# Patient Record
Sex: Female | Born: 1975 | Race: Black or African American | Hispanic: No | Marital: Single | Smoking: Never smoker
Health system: Southern US, Community
[De-identification: ages and names within clinical notes are randomized; demographics above are authoritative.]

## PROBLEM LIST (undated history)

## (undated) DIAGNOSIS — Z9851 Tubal ligation status: Secondary | ICD-10-CM

## (undated) DIAGNOSIS — Z302 Encounter for sterilization: Secondary | ICD-10-CM

## (undated) DIAGNOSIS — N200 Calculus of kidney: Secondary | ICD-10-CM

---

## 2000-03-09 DIAGNOSIS — N200 Calculus of kidney: Secondary | ICD-10-CM

## 2000-03-09 HISTORY — PX: LITHOTRIPSY: SUR834

## 2000-03-09 HISTORY — DX: Calculus of kidney: N20.0

## 2012-10-26 ENCOUNTER — Emergency Department (HOSPITAL_COMMUNITY): Payer: Self-pay

## 2012-10-26 ENCOUNTER — Encounter (HOSPITAL_COMMUNITY): Payer: Self-pay | Admitting: Emergency Medicine

## 2012-10-26 ENCOUNTER — Emergency Department (HOSPITAL_COMMUNITY)
Admission: EM | Admit: 2012-10-26 | Discharge: 2012-10-26 | Disposition: A | Discharge status: Home / Self Care | Payer: Self-pay | Attending: Emergency Medicine | Admitting: Emergency Medicine

## 2012-10-26 DIAGNOSIS — Z3202 Encounter for pregnancy test, result negative: Secondary | ICD-10-CM | POA: Insufficient documentation

## 2012-10-26 DIAGNOSIS — N949 Unspecified condition associated with female genital organs and menstrual cycle: Secondary | ICD-10-CM | POA: Insufficient documentation

## 2012-10-26 DIAGNOSIS — Z79899 Other long term (current) drug therapy: Secondary | ICD-10-CM | POA: Insufficient documentation

## 2012-10-26 DIAGNOSIS — N898 Other specified noninflammatory disorders of vagina: Secondary | ICD-10-CM | POA: Insufficient documentation

## 2012-10-26 DIAGNOSIS — N83209 Unspecified ovarian cyst, unspecified side: Secondary | ICD-10-CM | POA: Insufficient documentation

## 2012-10-26 DIAGNOSIS — R42 Dizziness and giddiness: Secondary | ICD-10-CM | POA: Insufficient documentation

## 2012-10-26 DIAGNOSIS — R109 Unspecified abdominal pain: Secondary | ICD-10-CM | POA: Insufficient documentation

## 2012-10-26 LAB — BASIC METABOLIC PANEL WITH GFR
BUN: 7 mg/dL (ref 6–23)
CO2: 26 meq/L (ref 19–32)
Calcium: 9.7 mg/dL (ref 8.4–10.5)
Chloride: 99 meq/L (ref 96–112)
Creatinine, Ser: 0.87 mg/dL (ref 0.50–1.10)
GFR calc Af Amer: >90 mL/min (ref >90)
GFR calc non Af Amer: 84 mL/min — ABNORMAL LOW (ref >90)
Glucose, Bld: 94 mg/dL (ref 70–99)
Potassium: 3.7 meq/L (ref 3.5–5.1)
Sodium: 135 meq/L (ref 135–145)

## 2012-10-26 LAB — CBC WITH DIFFERENTIAL/PLATELET
Basophils Relative: 0 % (ref 0–1)
Eosinophils Absolute: 0.1 10*3/uL (ref 0.0–0.7)
Hemoglobin: 11.7 g/dL — ABNORMAL LOW (ref 12.0–15.0)
MCH: 26 pg (ref 26.0–34.0)
MCHC: 32.1 g/dL (ref 30.0–36.0)
Monocytes Absolute: 0.4 10*3/uL (ref 0.1–1.0)
Monocytes Relative: 4 % (ref 3–12)
Neutrophils Relative %: 66 % (ref 43–77)

## 2012-10-26 LAB — URINALYSIS, ROUTINE W REFLEX MICROSCOPIC
Bilirubin Urine: NEGATIVE
Ketones, ur: NEGATIVE mg/dL
Nitrite: NEGATIVE
Urobilinogen, UA: 0.2 mg/dL (ref 0.0–1.0)

## 2012-10-26 LAB — WET PREP, GENITAL
Clue Cells Wet Prep HPF POC: NONE SEEN
Trich, Wet Prep: NONE SEEN
Yeast Wet Prep HPF POC: NONE SEEN

## 2012-10-26 LAB — URINE MICROSCOPIC-ADD ON

## 2012-10-26 LAB — POCT PREGNANCY, URINE: Preg Test, Ur: NEGATIVE

## 2012-10-26 MED ORDER — KETOROLAC TROMETHAMINE 30 MG/ML IJ SOLN
30.0000 mg | Freq: Once | INTRAMUSCULAR | Status: AC
  Administered 2012-10-26: 30 mg via INTRAVENOUS
  Filled 2012-10-26: qty 1

## 2012-10-26 MED ORDER — IBUPROFEN 800 MG PO TABS: 800.0000 mg | ORAL_TABLET | Freq: Three times a day (TID) | ORAL | Status: DC

## 2012-10-26 MED ORDER — SODIUM CHLORIDE 0.9 % IV BOLUS (SEPSIS)
1000.0000 mL | Freq: Once | INTRAVENOUS | Status: AC
Start: 2012-10-26 — End: 2012-10-26
  Administered 2012-10-26: 1000 mL via INTRAVENOUS

## 2012-10-26 NOTE — ED Notes (Signed)
Patient states that she has been having dizziness for a month but been avoiding being checked.  Additional complaints of vaginal discharge with lower abdominal pain.   She states that she had a change in sexual partners recently and had a condom break.

## 2012-10-26 NOTE — ED Notes (Signed)
Patient is alert and oriented x3.  She was given DC instructions and follow up visit instructions.  Patient gave verbal understanding. She was DC ambulatory under her own power to home.  V/S stable.  He was not showing any signs of distress on DC 

## 2012-10-26 NOTE — ED Notes (Signed)
Pt states she has been getting dizzy spells x 1 month.  Denies ever losing consciousness.  States that she has abd "discomfort" (RLQ).  States she has had vag discharge x 1 wk.  Describes it as yellow.

## 2012-10-26 NOTE — ED Provider Notes (Signed)
TIME SEEN: 8:00 PM  CHIEF COMPLAINT: Dizziness, right-sided abdominal pain, vaginal discharge  HPI: Patient is a 37 year old female G4 P4 who presents emergency department with lightheadedness for the past month. She states it has been intermittent and worse with change of position. She reports that she has been stressed recently and not eating as well as normal. She also reports over the past week she's had yellow vaginal discharge and right sided pelvic pain. She reports her last menstrual period was 3 weeks ago. She currently has an IUD. She has 2 sexual partners. No history of STI's. She states recently with one of her sexual partners the condom broke. She has not had any fever, nausea, vomiting, diarrhea, dysuria or hematuria, vaginal bleeding.  ROS: See HPI Constitutional: no fever  Eyes: no drainage  ENT: no runny nose   Cardiovascular:  no chest pain  Resp: no SOB  GI: no vomiting GU: no dysuria Integumentary: no rash  Allergy: no hives  Musculoskeletal: no leg swelling  Neurological: no slurred speech ROS otherwise negative  PAST MEDICAL HISTORY/PAST SURGICAL HISTORY:  History reviewed. No pertinent past medical history.  MEDICATIONS:  Prior to Admission medications   Medication Sig Start Date End Date Taking? Authorizing Provider  Multiple Vitamin (MULTIVITAMIN WITH MINERALS) TABS tablet Take 1 tablet by mouth daily.   Yes Historical Provider, MD  PARAGARD INTRAUTERINE COPPER IUD IUD 1 each by Intrauterine route once.   Yes Historical Provider, MD    ALLERGIES:  No Known Allergies  SOCIAL HISTORY:  History  Substance Use Topics  . Smoking status: Never Smoker   . Smokeless tobacco: Not on file  . Alcohol Use: Yes    FAMILY HISTORY: History reviewed. No pertinent family history.  EXAM: BP 127/100  Pulse 93  Temp(Src) 98.4 F (36.9 C) (Oral)  Resp 18  SpO2 95%  LMP 09/14/2012 CONSTITUTIONAL: Alert and oriented and responds appropriately to questions.  Well-appearing; well-nourished HEAD: Normocephalic EYES: Conjunctivae clear, PERRL ENT: normal nose; no rhinorrhea; moist mucous membranes; pharynx without lesions noted NECK: Supple, no meningismus, no LAD  CARD: RRR; S1 and S2 appreciated; no murmurs, no clicks, no rubs, no gallops RESP: Normal chest excursion without splinting or tachypnea; breath sounds clear and equal bilaterally; no wheezes, no rhonchi, no rales,  ABD/GI: Normal bowel sounds; non-distended; soft, palpation in the right pelvic region, no rebound, no guarding GU: Normal external genitalia, no genital lesions, white, thick vaginal discharge, no vaginal bleeding, patient has mild right adnexal tenderness without fullness, no cervical motion tenderness BACK:  The back appears normal and is non-tender to palpation, there is no CVA tenderness EXT: Normal ROM in all joints; non-tender to palpation; no edema; normal capillary refill; no cyanosis    SKIN: Normal color for age and race; warm NEURO: Moves all extremities equally PSYCH: The patient's mood and manner are appropriate. Grooming and personal hygiene are appropriate.  MEDICAL DECISION MAKING: Patient with lightheadedness for the past month has been intermittent. She has no risk factors for ACS or pulmonary embolus. She states she has not been eating or drinking well due to stress. Her symptoms seem to be related to change of position. She is hemodynamically stable the ED. Patient is also complaining of right-sided abdominal pain and vaginal discharge. We'll obtain urine, labs, pelvic exam with cultures and transvaginal ultrasound to evaluate for ectopic pregnancy, ovarian cysts, tubo-ovarian abscess. Given patient is well-appearing and appears comfortable in the room, doubt ovarian torsion.  ED PROGRESS: Labs are unremarkable other  than a mild leukocytosis. Patient does have a small amount of blood in her urine but no other sign of infection. Her transvaginal ultrasound shows a  right-sided ovarian cyst which may be a cause of her pain. She feels much better after Toradol IV fluids. Pelvic exam reveals mild right-sided adnexal tenderness without fullness. She also has white, thick vaginal discharge. Wet prep is pending. No cervical motion tenderness.  Wet prep negative. No signs or symptoms of sexual transmitted disease, PID that requires empiric treatment today. Current Chlamydia pending. We'll discharge home with return precautions, PCP followup, supportive care instructions. Patient verbalizes understanding and is comfortable this plan.  Abigail Maw Zakkary Thibault, DO 10/26/12 2112

## 2012-10-27 LAB — GC/CHLAMYDIA PROBE AMP
CT Probe RNA: NEGATIVE
GC Probe RNA: NEGATIVE

## 2012-11-01 ENCOUNTER — Ambulatory Visit: Payer: Medicaid Other | Admitting: Advanced Practice Midwife

## 2013-07-25 ENCOUNTER — Emergency Department (HOSPITAL_COMMUNITY)
Admission: EM | Admit: 2013-07-25 | Discharge: 2013-07-25 | Disposition: A | Discharge status: Home / Self Care | Payer: 59 | Source: Home / Self Care | Attending: Emergency Medicine | Admitting: Emergency Medicine

## 2013-07-25 ENCOUNTER — Encounter (HOSPITAL_COMMUNITY): Payer: Self-pay | Admitting: Emergency Medicine

## 2013-07-25 DIAGNOSIS — N390 Urinary tract infection, site not specified: Secondary | ICD-10-CM

## 2013-07-25 HISTORY — DX: Calculus of kidney: N20.0

## 2013-07-25 LAB — POCT URINALYSIS DIP (DEVICE)
BILIRUBIN URINE: NEGATIVE
GLUCOSE, UA: NEGATIVE mg/dL
KETONES UR: NEGATIVE mg/dL
NITRITE: NEGATIVE
PH: 5.5 (ref 5.0–8.0)
Protein, ur: >=300 mg/dL — AB
Specific Gravity, Urine: 1.025 (ref 1.005–1.030)
Urobilinogen, UA: 0.2 mg/dL (ref 0.0–1.0)

## 2013-07-25 MED ORDER — PHENAZOPYRIDINE HCL 200 MG PO TABS: 200.0000 mg | ORAL_TABLET | Freq: Three times a day (TID) | ORAL | Status: DC | PRN

## 2013-07-25 MED ORDER — CEPHALEXIN 500 MG PO CAPS: 500.0000 mg | ORAL_CAPSULE | Freq: Three times a day (TID) | ORAL | Status: DC

## 2013-07-25 NOTE — ED Notes (Signed)
C/o pressure and pain in low abdomen and low back onset 5/17.  No burning.  Voiding freq. in small amounts. No hematuria. After urinating she feels something like pressure in her upper abdomen that moves down.

## 2013-07-25 NOTE — Discharge Instructions (Signed)
Urinary Tract Infection  Urinary tract infections (UTIs) can develop anywhere along your urinary tract. Your urinary tract is your body's drainage system for removing wastes and extra water. Your urinary tract includes two kidneys, two ureters, a bladder, and a urethra. Your kidneys are a pair of bean-shaped organs. Each kidney is about the size of your fist. They are located below your ribs, one on each side of your spine.  CAUSES  Infections are caused by microbes, which are microscopic organisms, including fungi, viruses, and bacteria. These organisms are so small that they can only be seen through a microscope. Bacteria are the microbes that most commonly cause UTIs.  SYMPTOMS   Symptoms of UTIs may vary by age and gender of the patient and by the location of the infection. Symptoms in young women typically include a frequent and intense urge to urinate and a painful, burning feeling in the bladder or urethra during urination. Older women and men are more likely to be tired, shaky, and weak and have muscle aches and abdominal pain. A fever may mean the infection is in your kidneys. Other symptoms of a kidney infection include pain in your back or sides below the ribs, nausea, and vomiting.  DIAGNOSIS  To diagnose a UTI, your caregiver will ask you about your symptoms. Your caregiver also will ask to provide a urine sample. The urine sample will be tested for bacteria and white blood cells. White blood cells are made by your body to help fight infection.  TREATMENT   Typically, UTIs can be treated with medication. Because most UTIs are caused by a bacterial infection, they usually can be treated with the use of antibiotics. The choice of antibiotic and length of treatment depend on your symptoms and the type of bacteria causing your infection.  HOME CARE INSTRUCTIONS   If you were prescribed antibiotics, take them exactly as your caregiver instructs you. Finish the medication even if you feel better after you  have only taken some of the medication.   Drink enough water and fluids to keep your urine clear or pale yellow.   Avoid caffeine, tea, and carbonated beverages. They tend to irritate your bladder.   Empty your bladder often. Avoid holding urine for long periods of time.   Empty your bladder before and after sexual intercourse.   After a bowel movement, women should cleanse from front to back. Use each tissue only once.  SEEK MEDICAL CARE IF:    You have back pain.   You develop a fever.   Your symptoms do not begin to resolve within 3 days.  SEEK IMMEDIATE MEDICAL CARE IF:    You have severe back pain or lower abdominal pain.   You develop chills.   You have nausea or vomiting.   You have continued burning or discomfort with urination.  MAKE SURE YOU:    Understand these instructions.   Will watch your condition.   Will get help right away if you are not doing well or get worse.  Document Released: 12/03/2004 Document Revised: 08/25/2011 Document Reviewed: 04/03/2011  [employer] Patient Information 2014 [employer].

## 2013-07-25 NOTE — ED Provider Notes (Signed)
Chief Complaint   Chief Complaint  Patient presents with  . Urinary Tract Infection    History of Present Illness   Abigail Morris is a 38 year old female who has had a three-day history of urinary frequency, urgency, dysuria, and older. She denies any hematuria. She has felt chilled but had no fever. No nausea or vomiting. She's had generalized abdominal and back pain. Denies any GYN complaints. She has had a urinary tract infection but was years ago.  Review of Systems   Other than as noted above, the patient denies any of the following symptoms: General:  No fevers or chills. GI:  No abdominal pain, back pain, nausea, or vomiting. GU:  No hematuria or incontinence. GYN:  No discharge, itching, vulvar pain or lesions, pelvic pain, or abnormal vaginal bleeding.  PMFSH   Past medical history, family history, social history, meds, and allergies were reviewed.  She has a ParaGard intrauterine device.  Physical Examination     Vital signs:  BP 131/79  Pulse 105  Temp(Src) 98.4 F (36.9 C) (Oral)  Resp 16  SpO2 100%  LMP 07/15/2013 Gen:  Alert, oriented, in no distress. Lungs:  Clear to auscultation, no wheezes, rales or rhonchi. Heart:  Regular rhythm, no gallop or murmer. Abdomen:  Flat and soft. There was mild, generalized tenderness to palpation.  No guarding, or rebound.  No hepato-splenomegaly or mass.  Bowel sounds were normally active.  No hernia. Back:  Mild left CVA tenderness.  Skin:  Clear, warm and dry.  Labs   Results for orders placed during the hospital encounter of 07/25/13  POCT URINALYSIS DIP (DEVICE)      Result Value Ref Range   Glucose, UA NEGATIVE  NEGATIVE mg/dL   Bilirubin Urine NEGATIVE  NEGATIVE   Ketones, ur NEGATIVE  NEGATIVE mg/dL   Specific Gravity, Urine 1.025  1.005 - 1.030   Hgb urine dipstick LARGE (*) NEGATIVE   pH 5.5  5.0 - 8.0   Protein, ur >=300 (*) NEGATIVE mg/dL   Urobilinogen, UA 0.2  0.0 - 1.0 mg/dL   Nitrite NEGATIVE   NEGATIVE   Leukocytes, UA SMALL (*) NEGATIVE     A urine culture was obtained.  Results are pending at this time and we will call about any positive results.  Assessment   The encounter diagnosis was UTI (lower urinary tract infection).   No evidence of pyelonephritis.    Plan   1.  Meds:  The following meds were prescribed:   Discharge Medication List as of 07/25/2013  6:56 PM    START taking these medications   Details  cephALEXin (KEFLEX) 500 MG capsule Take 1 capsule (500 mg total) by mouth 3 (three) times daily., Starting 07/25/2013, Until Discontinued, Normal    phenazopyridine (PYRIDIUM) 200 MG tablet Take 1 tablet (200 mg total) by mouth 3 (three) times daily as needed for pain., Starting 07/25/2013, Until Discontinued, Normal        2.  Patient Education/Counseling:  The patient was given appropriate handouts, self care instructions, and instructed in symptomatic relief. The patient was told to avoid intercourse for 10 days, get extra fluids, and return for a follow up with her primary care doctor at the completion of treatment for a repeat UA and culture.    3.  Follow up:  The patient was told to follow up here if no better in 3 to 4 days, or sooner if becoming worse in any way, and given some red flag symptoms such  as fever, persistent vomiting, or severe flank or abdominal pain which would prompt immediate return.     Reuben Likesavid C Elmarie Devlin, MD 07/25/13 2007

## 2013-07-27 LAB — URINE CULTURE: Special Requests: NORMAL

## 2013-07-27 NOTE — ED Notes (Signed)
Urine culture: >100,000 colonies E. Coli.  Pt. adequately treated with Keflex. Desiree LucySuzanne M [facility] 07/27/2013

## 2013-07-28 NOTE — Progress Notes (Signed)
Quick Note:  Results are abnormal as noted, but have been adequately treated. No further action necessary. ______ 

## 2013-12-05 ENCOUNTER — Encounter (HOSPITAL_COMMUNITY): Payer: Self-pay | Admitting: Family Medicine

## 2013-12-05 ENCOUNTER — Emergency Department (HOSPITAL_COMMUNITY)
Admission: EM | Admit: 2013-12-05 | Discharge: 2013-12-05 | Disposition: A | Discharge status: Home / Self Care | Payer: 59 | Source: Home / Self Care | Attending: Family Medicine | Admitting: Family Medicine

## 2013-12-05 DIAGNOSIS — S39012A Strain of muscle, fascia and tendon of lower back, initial encounter: Secondary | ICD-10-CM

## 2013-12-05 DIAGNOSIS — M62838 Other muscle spasm: Secondary | ICD-10-CM

## 2013-12-05 DIAGNOSIS — IMO0002 Reserved for concepts with insufficient information to code with codable children: Secondary | ICD-10-CM

## 2013-12-05 MED ORDER — DICLOFENAC SODIUM 75 MG PO TBEC: 75.0000 mg | DELAYED_RELEASE_TABLET | Freq: Two times a day (BID) | ORAL | Status: DC

## 2013-12-05 MED ORDER — CYCLOBENZAPRINE HCL 5 MG PO TABS: 5.0000 mg | ORAL_TABLET | Freq: Three times a day (TID) | ORAL | Status: DC | PRN

## 2013-12-05 NOTE — Discharge Instructions (Signed)
You are suffering from muscle strain and spasm from your fall Please start the voltaren as an antinflammatory for your back pain and the flexeril to help with spasms Please stay active and make sure to stretch regularly. Please apply heat and massage several times per day to your injured area This will get better with time There is no evidence of permanent injury   Back Exercises These exercises may help you when beginning to rehabilitate your injury. Your symptoms may resolve with or without further involvement from your physician, physical therapist or athletic trainer. While completing these exercises, remember:   Restoring tissue flexibility helps normal motion to return to the joints. This allows healthier, less painful movement and activity.  An effective stretch should be held for at least 30 seconds.  A stretch should never be painful. You should only feel a gentle lengthening or release in the stretched tissue. STRETCH - Extension, Prone on Elbows   Lie on your stomach on the floor, a bed will be too soft. Place your palms about shoulder width apart and at the height of your head.  Place your elbows under your shoulders. If this is too painful, stack pillows under your chest.  Allow your body to relax so that your hips drop lower and make contact more completely with the floor.  Hold this position for __________ seconds.  Slowly return to lying flat on the floor. Repeat __________ times. Complete this exercise __________ times per day.  RANGE OF MOTION - Extension, Prone Press Ups   Lie on your stomach on the floor, a bed will be too soft. Place your palms about shoulder width apart and at the height of your head.  Keeping your back as relaxed as possible, slowly straighten your elbows while keeping your hips on the floor. You may adjust the placement of your hands to maximize your comfort. As you gain motion, your hands will come more underneath your shoulders.  Hold this  position __________ seconds.  Slowly return to lying flat on the floor. Repeat __________ times. Complete this exercise __________ times per day.  RANGE OF MOTION- Quadruped, Neutral Spine   Assume a hands and knees position on a firm surface. Keep your hands under your shoulders and your knees under your hips. You may place padding under your knees for comfort.  Drop your head and point your tail bone toward the ground below you. This will round out your low back like an angry cat. Hold this position for __________ seconds.  Slowly lift your head and release your tail bone so that your back sags into a large arch, like an old horse.  Hold this position for __________ seconds.  Repeat this until you feel limber in your low back.  Now, find your "sweet spot." This will be the most comfortable position somewhere between the two previous positions. This is your neutral spine. Once you have found this position, tense your stomach muscles to support your low back.  Hold this position for __________ seconds. Repeat __________ times. Complete this exercise __________ times per day.  STRETCH - Flexion, Single Knee to Chest   Lie on a firm bed or floor with both legs extended in front of you.  Keeping one leg in contact with the floor, bring your opposite knee to your chest. Hold your leg in place by either grabbing behind your thigh or at your knee.  Pull until you feel a gentle stretch in your low back. Hold __________ seconds.  Slowly  release your grasp and repeat the exercise with the opposite side. Repeat __________ times. Complete this exercise __________ times per day.  STRETCH - Hamstrings, Standing  Stand or sit and extend your right / left leg, placing your foot on a chair or foot stool  Keeping a slight arch in your low back and your hips straight forward.  Lead with your chest and lean forward at the waist until you feel a gentle stretch in the back of your right / left knee or  thigh. (When done correctly, this exercise requires leaning only a small distance.)  Hold this position for __________ seconds. Repeat __________ times. Complete this stretch __________ times per day. STRENGTHENING - Deep Abdominals, Pelvic Tilt   Lie on a firm bed or floor. Keeping your legs in front of you, bend your knees so they are both pointed toward the ceiling and your feet are flat on the floor.  Tense your lower abdominal muscles to press your low back into the floor. This motion will rotate your pelvis so that your tail bone is scooping upwards rather than pointing at your feet or into the floor.  With a gentle tension and even breathing, hold this position for __________ seconds. Repeat __________ times. Complete this exercise __________ times per day.  STRENGTHENING - Abdominals, Crunches   Lie on a firm bed or floor. Keeping your legs in front of you, bend your knees so they are both pointed toward the ceiling and your feet are flat on the floor. Cross your arms over your chest.  Slightly tip your chin down without bending your neck.  Tense your abdominals and slowly lift your trunk high enough to just clear your shoulder blades. Lifting higher can put excessive stress on the low back and does not further strengthen your abdominal muscles.  Control your return to the starting position. Repeat __________ times. Complete this exercise __________ times per day.  STRENGTHENING - Quadruped, Opposite UE/LE Lift   Assume a hands and knees position on a firm surface. Keep your hands under your shoulders and your knees under your hips. You may place padding under your knees for comfort.  Find your neutral spine and gently tense your abdominal muscles so that you can maintain this position. Your shoulders and hips should form a rectangle that is parallel with the floor and is not twisted.  Keeping your trunk steady, lift your right hand no higher than your shoulder and then your left  leg no higher than your hip. Make sure you are not holding your breath. Hold this position __________ seconds.  Continuing to keep your abdominal muscles tense and your back steady, slowly return to your starting position. Repeat with the opposite arm and leg. Repeat __________ times. Complete this exercise __________ times per day. Document Released: 03/13/2005 Document Revised: 05/18/2011 Document Reviewed: 06/07/2008 [facility] Patient Information 2015 [employer], [place]. This information is not intended to replace advice given to you by your health care provider. Make sure you discuss any questions you have with your health care provider.

## 2013-12-05 NOTE — ED Provider Notes (Addendum)
CSN: [id number]     Arrival date & time 12/05/13  1935 History   First MD Initiated Contact with Patient 12/05/13 2052     No chief complaint on file.  (Consider location/radiation/quality/duration/timing/severity/associated sxs/prior Treatment) HPI  Fall: Fell on Friday. Slipped on floor at [facility]. Landed on lower back. No initial pain. Came on later that night. Deneis LOC, head trauma, loss of sensation, loss of sensation or function of LE. No saddle anesthesia. Still with lower back pain today. Aleve 400mg  w/ some benefit. Worse as the day goes on. Improves w/ rest.    Past Medical History  Diagnosis Date  . Kidney stones 2002   Past Surgical History  Procedure Laterality Date  . Lithotripsy  2002   No family history on file. History  Substance Use Topics  . Smoking status: Never Smoker   . Smokeless tobacco: Not on file  . Alcohol Use: Yes     Comment: occasional   OB History   Grav Para Term Preterm Abortions TAB SAB Ect Mult Living                 Review of Systems Per HPI with all other pertinent systems negative.   Allergies  Review of patient's allergies indicates no known allergies.  Home Medications   Prior to Admission medications   Medication Sig Start Date End Date Taking? Authorizing Provider  cephALEXin (KEFLEX) 500 MG capsule Take 1 capsule (500 mg total) by mouth 3 (three) times daily. 07/25/13   Reuben Likes, MD  cyclobenzaprine (FLEXERIL) 5 MG tablet Take 1-2 tablets (5-10 mg total) by mouth 3 (three) times daily as needed for muscle spasms. 12/05/13   Ozella Rocks, MD  diclofenac (VOLTAREN) 75 MG EC tablet Take 1 tablet (75 mg total) by mouth 2 (two) times daily. 12/05/13   Ozella Rocks, MD  ibuprofen (ADVIL,MOTRIN) 800 MG tablet Take 1 tablet (800 mg total) by mouth 3 (three) times daily. 10/26/12   Kristen N Ward, DO  Multiple Vitamin (MULTIVITAMIN WITH MINERALS) TABS tablet Take 1 tablet by mouth daily.    Historical Provider, MD   PARAGARD INTRAUTERINE COPPER IUD IUD 1 each by Intrauterine route once.    Historical Provider, MD  phenazopyridine (PYRIDIUM) 200 MG tablet Take 1 tablet (200 mg total) by mouth 3 (three) times daily as needed for pain. 07/25/13   Reuben Likes, MD   BP 143/91  Pulse 78  Temp(Src) 98.9 F (37.2 C) (Oral)  Resp 16  SpO2 100% Physical Exam  Constitutional: She appears well-developed and well-nourished. No distress.  HENT:  Head: Normocephalic and atraumatic.  Eyes: EOM are normal. Pupils are equal, round, and reactive to light.  Neck: Normal range of motion.  Cardiovascular: Normal rate and normal heart sounds.   Pulmonary/Chest: Effort normal and breath sounds normal.  Abdominal: Soft.  Musculoskeletal:  L mid lower back perispinal ttp and muscle tightness.  FROM but slow w/ Rotation due to pain.   Neurological: She is alert. No cranial nerve deficit. Coordination normal.  Skin: Skin is warm. She is not diaphoretic.  Psychiatric: She has a normal mood and affect. Her behavior is normal. Judgment and thought content normal.    ED Course  Procedures (including critical care time) Labs Review Labs Reviewed - No data to display  Imaging Review No results found.   MDM   1. Muscle spasm   2. Back strain, initial encounter    Voltaren and flexeril Exercises, massage, rest,  heat Precautions given and all questions answered  Shelly Flattenavid Merrell, MD Family Medicine 12/05/2013, 9:22 PM      Ozella Rocksavid J Merrell, MD 12/05/13 2122  Ozella Rocksavid J Merrell, MD 12/06/13 [phone]

## 2013-12-05 NOTE — ED Notes (Signed)
Fell on back.  Onset 9/25.

## 2014-01-10 ENCOUNTER — Other Ambulatory Visit (HOSPITAL_COMMUNITY): Payer: Self-pay | Admitting: Family Medicine

## 2014-01-10 DIAGNOSIS — M5440 Lumbago with sciatica, unspecified side: Secondary | ICD-10-CM

## 2014-01-25 ENCOUNTER — Ambulatory Visit (HOSPITAL_COMMUNITY): Payer: 59

## 2014-01-26 ENCOUNTER — Ambulatory Visit (HOSPITAL_COMMUNITY): Payer: 59

## 2014-11-16 ENCOUNTER — Encounter (HOSPITAL_COMMUNITY): Payer: Self-pay | Admitting: *Deleted

## 2014-11-22 ENCOUNTER — Encounter (HOSPITAL_COMMUNITY): Payer: Self-pay | Admitting: Obstetrics and Gynecology

## 2014-11-22 DIAGNOSIS — Z302 Encounter for sterilization: Secondary | ICD-10-CM

## 2014-11-22 HISTORY — DX: Encounter for sterilization: Z30.2

## 2014-11-22 NOTE — H&P (Signed)
Abigail Morris is an 39 y.o. female [id number] for tubal ligation. Pt had Paragard IUD removed this year, desires permanent sterilization.D/W pt r/b/a of surgery, pt voices understanding.    Pertinent Gynecological History: No abn pap, last 12/14 WNL No STDs [id number] 2 female, 2 female 32wk-40wk SVD x 4  No LMP recorded.    Past Medical History  Diagnosis Date  . Kidney stones 2002  . Admission for tubal ligation 11/22/2014    Past Surgical History  Procedure Laterality Date  . Lithotripsy  2002    History reviewed. No pertinent family history.  Social History:  reports that she has never smoked. She does not have any smokeless tobacco history on file. She reports that she drinks alcohol. She reports that she does not use illicit drugs.single, [facility] [employer]  Allergies:  Allergies  Allergen Reactions  . Tramadol Hcl Itching  Meds boric acid, flexeril, diclofenac, MVI, trazadone,     Review of Systems  Constitutional: Negative.   HENT: Negative.   Eyes: Negative.   Respiratory: Negative.   Cardiovascular: Negative.   Gastrointestinal: Negative.   Genitourinary: Negative.   Musculoskeletal: Negative.   Skin: Negative.   Neurological: Negative.   Psychiatric/Behavioral: Negative.     There were no vitals taken for this visit. Physical Exam  Constitutional: She is oriented to person, place, and time. She appears well-developed and well-nourished.  HENT:  Head: Normocephalic and atraumatic.  Cardiovascular: Normal rate and regular rhythm.   Respiratory: Effort normal and breath sounds normal. No respiratory distress. She has no wheezes.  GI: Soft. Bowel sounds are normal. She exhibits no distension. There is no tenderness.  Musculoskeletal: Normal range of motion.  Neurological: She is alert and oriented to person, place, and time.  Skin: Skin is warm and dry.  Psychiatric: She has a normal mood and affect. Her behavior is normal.    No results found for this or any  previous visit (from the past 24 hour(s)).  No results found.  Assessment/Plan: 14NW [id number] for BTL D/w pt r/b/a wishes to proceed  Bovard-Stuckert, Leveta Wahab 11/22/2014, 9:23 PM

## 2014-11-23 ENCOUNTER — Encounter (HOSPITAL_COMMUNITY): Payer: Self-pay | Admitting: Emergency Medicine

## 2014-11-23 ENCOUNTER — Ambulatory Visit (HOSPITAL_COMMUNITY)
Admission: RE | Admit: 2014-11-23 | Discharge: 2014-11-23 | Disposition: A | Discharge status: Home / Self Care | Payer: 59 | Source: Ambulatory Visit | Attending: Obstetrics and Gynecology | Admitting: Obstetrics and Gynecology

## 2014-11-23 ENCOUNTER — Encounter (HOSPITAL_COMMUNITY)
Admission: RE | Disposition: A | Discharge status: Home / Self Care | Payer: Self-pay | Source: Ambulatory Visit | Attending: Obstetrics and Gynecology

## 2014-11-23 ENCOUNTER — Ambulatory Visit (HOSPITAL_COMMUNITY): Payer: 59 | Admitting: Anesthesiology

## 2014-11-23 DIAGNOSIS — Z30432 Encounter for removal of intrauterine contraceptive device: Secondary | ICD-10-CM | POA: Diagnosis not present

## 2014-11-23 DIAGNOSIS — Z302 Encounter for sterilization: Secondary | ICD-10-CM

## 2014-11-23 DIAGNOSIS — Z9851 Tubal ligation status: Secondary | ICD-10-CM

## 2014-11-23 HISTORY — PX: IUD REMOVAL: SHX5392

## 2014-11-23 HISTORY — PX: LAPAROSCOPIC TUBAL LIGATION: SHX1937

## 2014-11-23 HISTORY — DX: Encounter for sterilization: Z30.2

## 2014-11-23 HISTORY — DX: Tubal ligation status: Z98.51

## 2014-11-23 LAB — CBC
HEMATOCRIT: 36.6 % (ref 36.0–46.0)
HEMOGLOBIN: 11.8 g/dL — AB (ref 12.0–15.0)
MCH: 26.5 pg (ref 26.0–34.0)
MCHC: 32.2 g/dL (ref 30.0–36.0)
MCV: 82.2 fL (ref 78.0–100.0)
Platelets: 299 10*3/uL (ref 150–400)
RBC: 4.45 MIL/uL (ref 3.87–5.11)
RDW: 13.9 % (ref 11.5–15.5)
WBC: 9.3 10*3/uL (ref 4.0–10.5)

## 2014-11-23 LAB — PREGNANCY, URINE: PREG TEST UR: NEGATIVE

## 2014-11-23 SURGERY — LIGATION, FALLOPIAN TUBE, LAPAROSCOPIC
Anesthesia: General

## 2014-11-23 MED ORDER — DEXAMETHASONE SODIUM PHOSPHATE 10 MG/ML IJ SOLN
INTRAMUSCULAR | Status: AC
  Filled 2014-11-23: qty 1

## 2014-11-23 MED ORDER — ONDANSETRON HCL 4 MG/2ML IJ SOLN
INTRAMUSCULAR | Status: DC | PRN
Start: 2014-11-23 — End: 2014-11-23
  Administered 2014-11-23: 4 mg via INTRAVENOUS

## 2014-11-23 MED ORDER — SCOPOLAMINE 1 MG/3DAYS TD PT72
MEDICATED_PATCH | TRANSDERMAL | Status: AC
  Administered 2014-11-23: 1.5 mg via TRANSDERMAL
  Filled 2014-11-23: qty 1

## 2014-11-23 MED ORDER — IBUPROFEN 800 MG PO TABS: 800.0000 mg | ORAL_TABLET | Freq: Three times a day (TID) | ORAL | Status: AC | PRN

## 2014-11-23 MED ORDER — LIDOCAINE HCL (CARDIAC) 20 MG/ML IV SOLN
INTRAVENOUS | Status: AC
  Filled 2014-11-23: qty 5

## 2014-11-23 MED ORDER — SODIUM CHLORIDE 0.9 % IJ SOLN
INTRAMUSCULAR | Status: AC
  Filled 2014-11-23: qty 10

## 2014-11-23 MED ORDER — MEPERIDINE HCL 25 MG/ML IJ SOLN: 6.2500 mg | INTRAMUSCULAR | Status: DC | PRN

## 2014-11-23 MED ORDER — OXYCODONE-ACETAMINOPHEN 5-325 MG PO TABS: 1.0000 | ORAL_TABLET | Freq: Four times a day (QID) | ORAL | Status: AC | PRN

## 2014-11-23 MED ORDER — SCOPOLAMINE 1 MG/3DAYS TD PT72
1.0000 | MEDICATED_PATCH | Freq: Once | TRANSDERMAL | Status: DC
  Administered 2014-11-23: 1.5 mg via TRANSDERMAL

## 2014-11-23 MED ORDER — METOCLOPRAMIDE HCL 5 MG/ML IJ SOLN: 10.0000 mg | Freq: Once | INTRAMUSCULAR | Status: DC | PRN

## 2014-11-23 MED ORDER — SODIUM CHLORIDE 0.9 % IJ SOLN
INTRAMUSCULAR | Status: DC | PRN
  Administered 2014-11-23: 10 mL

## 2014-11-23 MED ORDER — FENTANYL CITRATE (PF) 250 MCG/5ML IJ SOLN
INTRAMUSCULAR | Status: AC
  Filled 2014-11-23: qty 5

## 2014-11-23 MED ORDER — NEOSTIGMINE METHYLSULFATE 10 MG/10ML IV SOLN
INTRAVENOUS | Status: DC | PRN
  Administered 2014-11-23: 3 mg via INTRAVENOUS

## 2014-11-23 MED ORDER — MIDAZOLAM HCL 2 MG/2ML IJ SOLN
INTRAMUSCULAR | Status: DC | PRN
  Administered 2014-11-23: 2 mg via INTRAVENOUS

## 2014-11-23 MED ORDER — LACTATED RINGERS IV SOLN
INTRAVENOUS | Status: DC
  Administered 2014-11-23 (×2): via INTRAVENOUS

## 2014-11-23 MED ORDER — LACTATED RINGERS IV SOLN
INTRAVENOUS | Status: DC
  Administered 2014-11-23: 10:00:00 via INTRAVENOUS

## 2014-11-23 MED ORDER — PROPOFOL 10 MG/ML IV BOLUS
INTRAVENOUS | Status: AC
  Filled 2014-11-23: qty 20

## 2014-11-23 MED ORDER — ONDANSETRON HCL 4 MG/2ML IJ SOLN
INTRAMUSCULAR | Status: AC
  Filled 2014-11-23: qty 2

## 2014-11-23 MED ORDER — MIDAZOLAM HCL 2 MG/2ML IJ SOLN
INTRAMUSCULAR | Status: AC
  Filled 2014-11-23: qty 2

## 2014-11-23 MED ORDER — HYDROCODONE-ACETAMINOPHEN 7.5-325 MG PO TABS
ORAL_TABLET | ORAL | Status: AC
  Filled 2014-11-23: qty 1

## 2014-11-23 MED ORDER — BUPIVACAINE HCL (PF) 0.25 % IJ SOLN
INTRAMUSCULAR | Status: AC
  Filled 2014-11-23: qty 30

## 2014-11-23 MED ORDER — FENTANYL CITRATE (PF) 100 MCG/2ML IJ SOLN
INTRAMUSCULAR | Status: DC | PRN
  Administered 2014-11-23: 100 ug via INTRAVENOUS
  Administered 2014-11-23: 50 ug via INTRAVENOUS

## 2014-11-23 MED ORDER — FENTANYL CITRATE (PF) 100 MCG/2ML IJ SOLN
INTRAMUSCULAR | Status: AC
  Filled 2014-11-23: qty 2

## 2014-11-23 MED ORDER — GLYCOPYRROLATE 0.2 MG/ML IJ SOLN
INTRAMUSCULAR | Status: DC | PRN
  Administered 2014-11-23: 0.4 mg via INTRAVENOUS

## 2014-11-23 MED ORDER — FENTANYL CITRATE (PF) 100 MCG/2ML IJ SOLN
25.0000 ug | INTRAMUSCULAR | Status: DC | PRN
  Administered 2014-11-23: 50 ug via INTRAVENOUS

## 2014-11-23 MED ORDER — PROPOFOL 10 MG/ML IV BOLUS
INTRAVENOUS | Status: DC | PRN
  Administered 2014-11-23: 200 mg via INTRAVENOUS

## 2014-11-23 MED ORDER — BUPIVACAINE HCL (PF) 0.25 % IJ SOLN
INTRAMUSCULAR | Status: DC | PRN
  Administered 2014-11-23: 9 mL

## 2014-11-23 MED ORDER — ROCURONIUM BROMIDE 100 MG/10ML IV SOLN
INTRAVENOUS | Status: DC | PRN
  Administered 2014-11-23: 30 mg via INTRAVENOUS

## 2014-11-23 MED ORDER — DEXAMETHASONE SODIUM PHOSPHATE 10 MG/ML IJ SOLN
INTRAMUSCULAR | Status: DC | PRN
  Administered 2014-11-23: 5 mg via INTRAVENOUS

## 2014-11-23 MED ORDER — HYDROCODONE-ACETAMINOPHEN 7.5-325 MG PO TABS
1.0000 | ORAL_TABLET | Freq: Once | ORAL | Status: AC | PRN
  Administered 2014-11-23: 1 via ORAL

## 2014-11-23 MED ORDER — METOCLOPRAMIDE HCL 5 MG/ML IJ SOLN
INTRAMUSCULAR | Status: AC
  Filled 2014-11-23: qty 2

## 2014-11-23 MED ORDER — LIDOCAINE HCL 1 % IJ SOLN
INTRAMUSCULAR | Status: AC
  Filled 2014-11-23: qty 20

## 2014-11-23 MED ORDER — 0.9 % SODIUM CHLORIDE (POUR BTL) OPTIME
TOPICAL | Status: DC | PRN
  Administered 2014-11-23: 1000 mL

## 2014-11-23 MED ORDER — LIDOCAINE HCL (CARDIAC) 20 MG/ML IV SOLN
INTRAVENOUS | Status: DC | PRN
  Administered 2014-11-23: 100 mg via INTRAVENOUS

## 2014-11-23 SURGICAL SUPPLY — 29 items
CATH ROBINSON RED A/P 16FR (CATHETERS) ×4 IMPLANT
CHLORAPREP W/TINT 26ML (MISCELLANEOUS) ×4 IMPLANT
CLOTH BEACON ORANGE TIMEOUT ST (SAFETY) ×4 IMPLANT
CONTAINER PREFILL 10% NBF 60ML (FORM) ×8 IMPLANT
DECANTER SPIKE VIAL GLASS SM (MISCELLANEOUS) ×8 IMPLANT
DRSG COVADERM PLUS 2X2 (GAUZE/BANDAGES/DRESSINGS) ×8 IMPLANT
DRSG OPSITE POSTOP 3X4 (GAUZE/BANDAGES/DRESSINGS) IMPLANT
GLOVE BIO SURGEON STRL SZ 6.5 (GLOVE) ×3 IMPLANT
GLOVE BIO SURGEONS STRL SZ 6.5 (GLOVE) ×1
GOWN STRL REUS W/TWL LRG LVL3 (GOWN DISPOSABLE) ×8 IMPLANT
LIQUID BAND (GAUZE/BANDAGES/DRESSINGS) ×4 IMPLANT
NEEDLE INSUFFLATION 120MM (ENDOMECHANICALS) ×4 IMPLANT
NEEDLE SPNL 22GX3.5 QUINCKE BK (NEEDLE) ×4 IMPLANT
PACK LAPAROSCOPY BASIN (CUSTOM PROCEDURE TRAY) ×4 IMPLANT
PACK VAGINAL MINOR WOMEN LF (CUSTOM PROCEDURE TRAY) ×4 IMPLANT
PAD POSITIONING PINK XL (MISCELLANEOUS) ×4 IMPLANT
PAD PREP 24X48 CUFFED NSTRL (MISCELLANEOUS) ×4 IMPLANT
POUCH SPECIMEN RETRIEVAL 10MM (ENDOMECHANICALS) IMPLANT
PROTECTOR NERVE ULNAR (MISCELLANEOUS) ×8 IMPLANT
SHEARS HARMONIC ACE PLUS 36CM (ENDOMECHANICALS) IMPLANT
SUT VIC AB 3-0 CTX 36 (SUTURE) IMPLANT
SUT VIC AB 3-0 PS2 18 (SUTURE) ×2
SUT VIC AB 3-0 PS2 18XBRD (SUTURE) ×2 IMPLANT
SUT VICRYL 0 UR6 27IN ABS (SUTURE) IMPLANT
TOWEL OR 17X24 6PK STRL BLUE (TOWEL DISPOSABLE) ×8 IMPLANT
TROCAR XCEL NON-BLD 11X100MML (ENDOMECHANICALS) ×4 IMPLANT
TROCAR XCEL NON-BLD 5MMX100MML (ENDOMECHANICALS) IMPLANT
WARMER LAPAROSCOPE (MISCELLANEOUS) ×4 IMPLANT
WATER STERILE IRR 1000ML POUR (IV SOLUTION) ×4 IMPLANT

## 2014-11-23 NOTE — Anesthesia Postprocedure Evaluation (Signed)
Anesthesia Post Note  Patient: Abigail Morris  Procedure(s) Performed: Procedure(s) (LRB): LAPAROSCOPIC BILATERAL TUBAL LIGATION (Bilateral) INTRAUTERINE DEVICE (IUD) REMOVAL (N/A)  Anesthesia type: General  Patient location: PACU  Post pain: Pain level controlled  Post assessment: Post-op Vital signs reviewed  Last Vitals:  Filed Vitals:   11/23/14 1300  BP: 118/77  Pulse: 64  Temp:   Resp: 11    Post vital signs: Reviewed  Level of consciousness: sedated  Complications: No apparent anesthesia complications

## 2014-11-23 NOTE — Interval H&P Note (Signed)
History and Physical Interval Note:  11/23/2014 10:11 AM  Tonnette Y Lightle  has presented today for surgery, with the diagnosis of Sterilization  The various methods of treatment have been discussed with the patient and family. After consideration of risks, benefits and other options for treatment, the patient has consented to  Procedure(s): LAPAROSCOPIC BILATERAL TUBAL LIGATION (Bilateral) INTRAUTERINE DEVICE (IUD) REMOVAL (N/A) as a surgical intervention .  The patient's history has been reviewed, patient examined, no change in status, stable for surgery.  I have reviewed the patient's chart and labs.  Questions were answered to the patient's satisfaction.     Bovard-Stuckert, Julene Rahn

## 2014-11-23 NOTE — Anesthesia Procedure Notes (Signed)
Procedure Name: Intubation Date/Time: 11/23/2014 10:53 AM Performed by: Elgie Congo Pre-anesthesia Checklist: Patient identified, Emergency Drugs available, Suction available and Patient being monitored Patient Re-evaluated:Patient Re-evaluated prior to inductionOxygen Delivery Method: Circle system utilized Preoxygenation: Pre-oxygenation with 100% oxygen Intubation Type: IV induction Ventilation: Mask ventilation without difficulty Laryngoscope Size: Mac and 3 Grade View: Grade I Tube type: Oral Tube size: 7.0 mm Number of attempts: 1 Airway Equipment and Method: Stylet Placement Confirmation: ETT inserted through vocal cords under direct vision,  positive ETCO2 and breath sounds checked- equal and bilateral Secured at: 21 cm Tube secured with: Tape Dental Injury: Teeth and Oropharynx as per pre-operative assessment

## 2014-11-23 NOTE — Anesthesia Preprocedure Evaluation (Signed)
Anesthesia Evaluation  Patient identified by MRN, date of birth, ID band Patient awake    Reviewed: Allergy & Precautions, NPO status , Patient's Chart, lab work & pertinent test results  Airway Mallampati: II  TM Distance: >3 FB Neck ROM: Full    Dental no notable dental hx. (+) Teeth Intact   Pulmonary neg pulmonary ROS,    Pulmonary exam normal breath sounds clear to auscultation       Cardiovascular negative cardio ROS Normal cardiovascular exam Rhythm:Regular Rate:Normal     Neuro/Psych negative neurological ROS  negative psych ROS   GI/Hepatic negative GI ROS,   Endo/Other  Obesity  Renal/GU Renal diseaseHx/o Renal calculi  negative genitourinary   Musculoskeletal negative musculoskeletal ROS (+)   Abdominal (+) + obese,   Peds  Hematology   Anesthesia Other Findings   Reproductive/Obstetrics Desires sterilization                             Anesthesia Physical Anesthesia Plan  ASA: II  Anesthesia Plan: General   Post-op Pain Management:    Induction: Intravenous  Airway Management Planned: Oral ETT  Additional Equipment:   Intra-op Plan:   Post-operative Plan: Extubation in OR  Informed Consent: I have reviewed the patients History and Physical, chart, labs and discussed the procedure including the risks, benefits and alternatives for the proposed anesthesia with the patient or authorized representative who has indicated his/her understanding and acceptance.   Dental advisory given  Plan Discussed with: Anesthesiologist, CRNA and Surgeon  Anesthesia Plan Comments:         Anesthesia Quick Evaluation

## 2014-11-23 NOTE — Brief Op Note (Signed)
11/23/2014  11:38 AM  PATIENT:  Abigail Morris  39 y.o. female  PRE-OPERATIVE DIAGNOSIS:  Sterilization  POST-OPERATIVE DIAGNOSIS:  Sterilization  PROCEDURE:  Procedure(s): LAPAROSCOPIC BILATERAL TUBAL LIGATION (Bilateral) INTRAUTERINE DEVICE (IUD) REMOVAL (N/A)  SURGEON:  Surgeon(s) and Role:    * Sherian Rein, MD - Primary  ANESTHESIA:   local and general  EBL:  Total I/O In: 1000 [I.V.:1000] Out: 120 [Urine:100; Blood:20]  BLOOD ADMINISTERED:none  DRAINS: none   LOCAL MEDICATIONS USED:  MARCAINE     SPECIMEN:  No Specimen  DISPOSITION OF SPECIMEN:  N/A  COUNTS:  YES  TOURNIQUET:  * No tourniquets in log *  DICTATION: .Other Dictation: Dictation Number [id number]  PLAN OF CARE: Discharge to home after PACU  PATIENT DISPOSITION:  PACU - hemodynamically stable.   Delay start of Pharmacological VTE agent (>24hrs) due to surgical blood loss or risk of bleeding: not applicable

## 2014-11-23 NOTE — Discharge Instructions (Signed)

## 2014-11-23 NOTE — Transfer of Care (Signed)
Immediate Anesthesia Transfer of Care Note  Patient: Abigail Morris  Procedure(s) Performed: Procedure(s): LAPAROSCOPIC BILATERAL TUBAL LIGATION (Bilateral) INTRAUTERINE DEVICE (IUD) REMOVAL (N/A)  Patient Location: PACU  Anesthesia Type:General  Level of Consciousness: awake, alert  and oriented  Airway & Oxygen Therapy: Patient Spontanous Breathing and Patient connected to nasal cannula oxygen  Post-op Assessment: Report given to RN, Post -op Vital signs reviewed and stable and Patient moving all extremities  Post vital signs: Reviewed and stable  Last Vitals:  Filed Vitals:   11/23/14 0942  BP: 137/84  Pulse: 85  Temp: 36.8 C  Resp: 16    Complications: No apparent anesthesia complications

## 2014-11-24 NOTE — Op Note (Signed)
NAME:  Abigail Morris, WORTH NO.:  [id number]  MEDICAL RECORD NO.:  [id number]  LOCATION:  [facility]                          FACILITY:  [facility]  PHYSICIAN:  Sherron Monday, MD        DATE OF BIRTH:  1975-06-03  DATE OF PROCEDURE:  11/23/2014 DATE OF DISCHARGE:  11/23/2014                              OPERATIVE REPORT   PREOPERATIVE DIAGNOSIS:  Undesired fertility, desires removal of ParaGard IUD.  POSTOPERATIVE DIAGNOSIS:  Undesired fertility, desires removal of ParaGard IUD.  PROCEDURE:  Laparoscopic bilateral tubal ligation by fulguration and ParaGard IUD removal.  SURGEON:  Sherron Monday, MD.  ANESTHESIA:  8 mL of 0.25% Marcaine and general anesthesia.  IV FLUIDS:  2000 mL.  URINE OUTPUT:  100 mL.  Clear urine at the start of the procedure.  EBL:  Approximately 20 mL.  PATHOLOGY:  None.  COMPLICATIONS:  None.  DESCRIPTION OF PROCEDURE:  After informed consent was reviewed with the patient, she was transported to the OR, placed on the table in supine position.  She was placed in the [place] stirrups and her arms were draped.  A brief time-out was performed.  The patient was anesthetized and open-sided speculum was placed in the vagina.  The IUD strings were easily seen, grasped and removed.  The Hulka manipulator was then placed in her uterus with the aid of a single-tooth tenaculum.  The bladder was sterilely drained.  Gloves were changed.  Attention was turned to the abdominal portion of the case.  An approximately 2 cm infraumbilical incision was made using the Veress needle.  Pneumoperitoneum was obtained after the hanging drop test.  The operative scope was used. The tubes, ovaries, and uterus were constantly identified and appeared normal.  The tubes were ligated using the triple burn technique with the Kleppinger forceps and aid of the ammeter bilaterally.  Hemostasis was assured.  The gas was evacuated from the abdomen.  The trocar was placed under direct  visualization.  The trocar was removed.  A single deep suture of 0 Vicryl was used to reapproximate the fascia.  A 3-0 was used to approximate the subcu tissue.  Dermabond was applied.  The patient tolerated the procedure well.  Sponge, lap, and needle counts were correct x2 per the operating room staff.     Sherron Monday, MD     JB/MEDQ  D:  11/23/2014  T:  11/23/2014  Job:  [id number]

## 2014-11-26 ENCOUNTER — Encounter (HOSPITAL_COMMUNITY): Payer: Self-pay | Admitting: Obstetrics and Gynecology

## 2015-03-22 DIAGNOSIS — Z0000 Encounter for general adult medical examination without abnormal findings: Secondary | ICD-10-CM | POA: Diagnosis not present

## 2015-03-22 DIAGNOSIS — Z113 Encounter for screening for infections with a predominantly sexual mode of transmission: Secondary | ICD-10-CM | POA: Diagnosis not present

## 2015-07-31 ENCOUNTER — Other Ambulatory Visit: Payer: Self-pay

## 2015-07-31 DIAGNOSIS — Z1231 Encounter for screening mammogram for malignant neoplasm of breast: Secondary | ICD-10-CM

## 2015-08-26 ENCOUNTER — Ambulatory Visit
Admission: RE | Admit: 2015-08-26 | Discharge: 2015-08-26 | Disposition: A | Discharge status: Home / Self Care | Payer: 59 | Source: Ambulatory Visit

## 2015-08-26 DIAGNOSIS — Z1231 Encounter for screening mammogram for malignant neoplasm of breast: Secondary | ICD-10-CM

## 2015-08-26 MED FILL — metroNIDAZOLE 0.75 % GEL: 0.75 | 5 days supply | Qty: 70 | Fill #0

## 2016-08-19 ENCOUNTER — Other Ambulatory Visit: Payer: Self-pay | Admitting: Obstetrics and Gynecology

## 2016-08-19 DIAGNOSIS — Z1231 Encounter for screening mammogram for malignant neoplasm of breast: Secondary | ICD-10-CM

## 2016-10-19 ENCOUNTER — Ambulatory Visit: Payer: 59

## 2016-10-19 ENCOUNTER — Ambulatory Visit
Admission: RE | Admit: 2016-10-19 | Discharge: 2016-10-19 | Disposition: A | Discharge status: Home / Self Care | Payer: PRIVATE HEALTH INSURANCE | Source: Ambulatory Visit | Attending: Obstetrics and Gynecology | Admitting: Obstetrics and Gynecology

## 2016-10-19 DIAGNOSIS — Z1231 Encounter for screening mammogram for malignant neoplasm of breast: Secondary | ICD-10-CM

## 2018-01-28 ENCOUNTER — Other Ambulatory Visit: Payer: Self-pay | Admitting: Obstetrics and Gynecology

## 2018-01-28 DIAGNOSIS — Z1231 Encounter for screening mammogram for malignant neoplasm of breast: Secondary | ICD-10-CM

## 2018-12-30 ENCOUNTER — Other Ambulatory Visit: Payer: Self-pay | Admitting: Obstetrics and Gynecology

## 2018-12-30 DIAGNOSIS — Z1231 Encounter for screening mammogram for malignant neoplasm of breast: Secondary | ICD-10-CM

## 2019-02-20 ENCOUNTER — Other Ambulatory Visit: Payer: Self-pay

## 2019-02-20 ENCOUNTER — Ambulatory Visit
Admission: RE | Admit: 2019-02-20 | Discharge: 2019-02-20 | Disposition: A | Discharge status: Home / Self Care | Payer: BC Managed Care – PPO | Source: Ambulatory Visit | Attending: Obstetrics and Gynecology | Admitting: Obstetrics and Gynecology

## 2019-02-20 DIAGNOSIS — Z1231 Encounter for screening mammogram for malignant neoplasm of breast: Secondary | ICD-10-CM

## 2019-02-22 ENCOUNTER — Other Ambulatory Visit: Payer: Self-pay | Admitting: Obstetrics and Gynecology

## 2019-02-22 DIAGNOSIS — R928 Other abnormal and inconclusive findings on diagnostic imaging of breast: Secondary | ICD-10-CM

## 2019-02-28 ENCOUNTER — Other Ambulatory Visit: Payer: BC Managed Care – PPO

## 2019-03-08 ENCOUNTER — Other Ambulatory Visit: Payer: Self-pay

## 2019-03-08 ENCOUNTER — Ambulatory Visit
Admission: RE | Admit: 2019-03-08 | Discharge: 2019-03-08 | Disposition: A | Discharge status: Home / Self Care | Payer: BC Managed Care – PPO | Source: Ambulatory Visit | Attending: Obstetrics and Gynecology | Admitting: Obstetrics and Gynecology

## 2019-03-08 DIAGNOSIS — R928 Other abnormal and inconclusive findings on diagnostic imaging of breast: Secondary | ICD-10-CM

## 2020-01-09 ENCOUNTER — Other Ambulatory Visit: Payer: Self-pay | Admitting: Obstetrics and Gynecology

## 2020-01-09 DIAGNOSIS — Z1231 Encounter for screening mammogram for malignant neoplasm of breast: Secondary | ICD-10-CM

## 2020-02-23 ENCOUNTER — Other Ambulatory Visit: Payer: Self-pay

## 2020-02-23 ENCOUNTER — Ambulatory Visit
Admission: RE | Admit: 2020-02-23 | Discharge: 2020-02-23 | Disposition: A | Discharge status: Home / Self Care | Payer: BC Managed Care – PPO | Source: Ambulatory Visit | Attending: Obstetrics and Gynecology | Admitting: Obstetrics and Gynecology

## 2020-02-23 DIAGNOSIS — Z1231 Encounter for screening mammogram for malignant neoplasm of breast: Secondary | ICD-10-CM

## 2020-12-21 IMAGING — MG DIGITAL SCREENING BILAT W/ TOMO W/ CAD
8 series · 8 of 24 positions shown · non-contrast
Comparison: Previous exam(s).

CLINICAL DATA: Screening.

EXAM:
DIGITAL SCREENING BILATERAL MAMMOGRAM WITH TOMO AND CAD

[L MLO synth-2D]
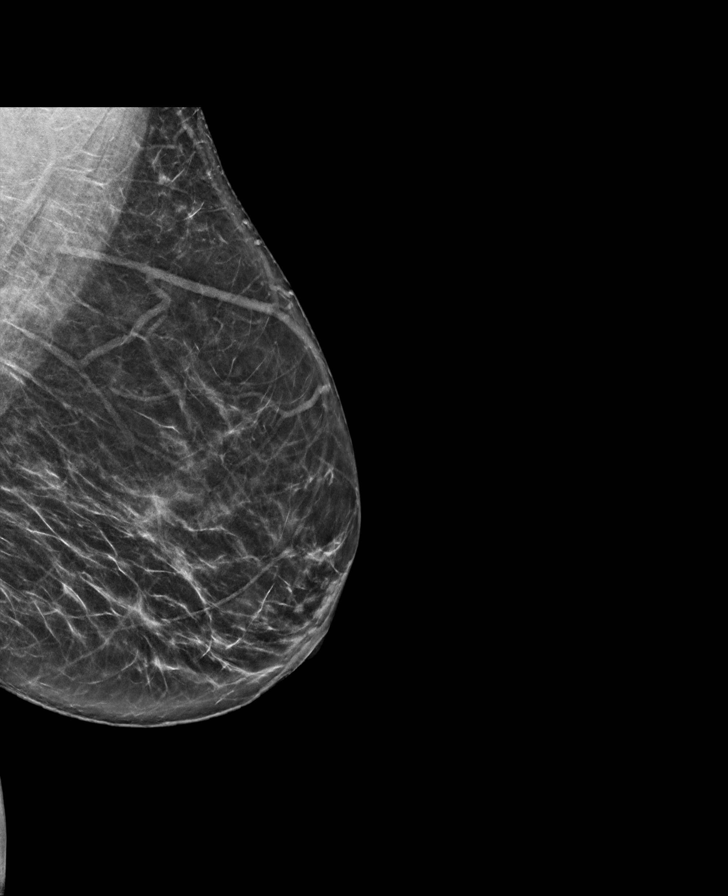

[L CC synth-2D]
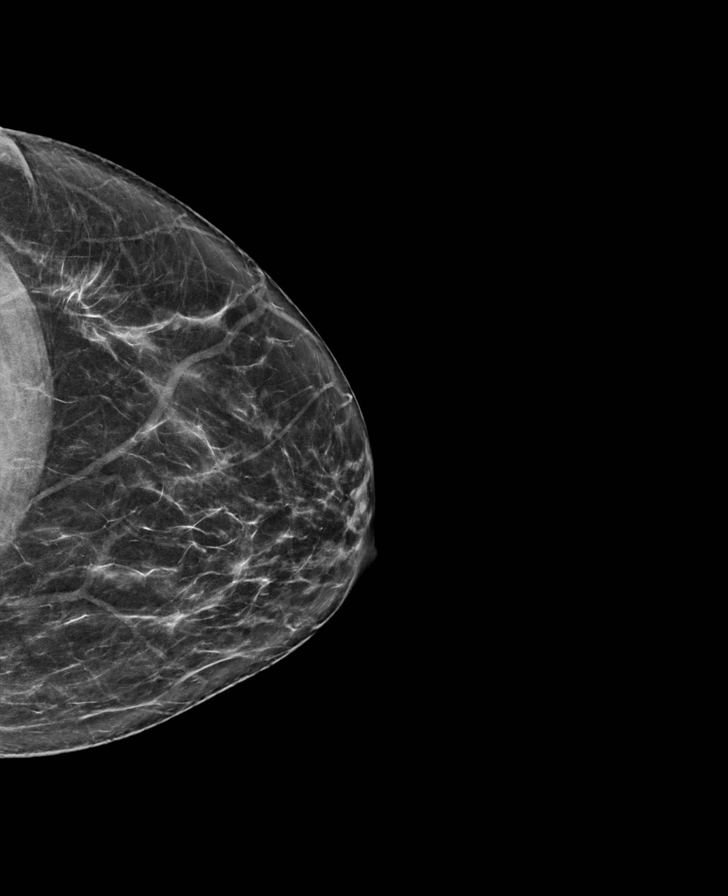

[R CC synth-2D]
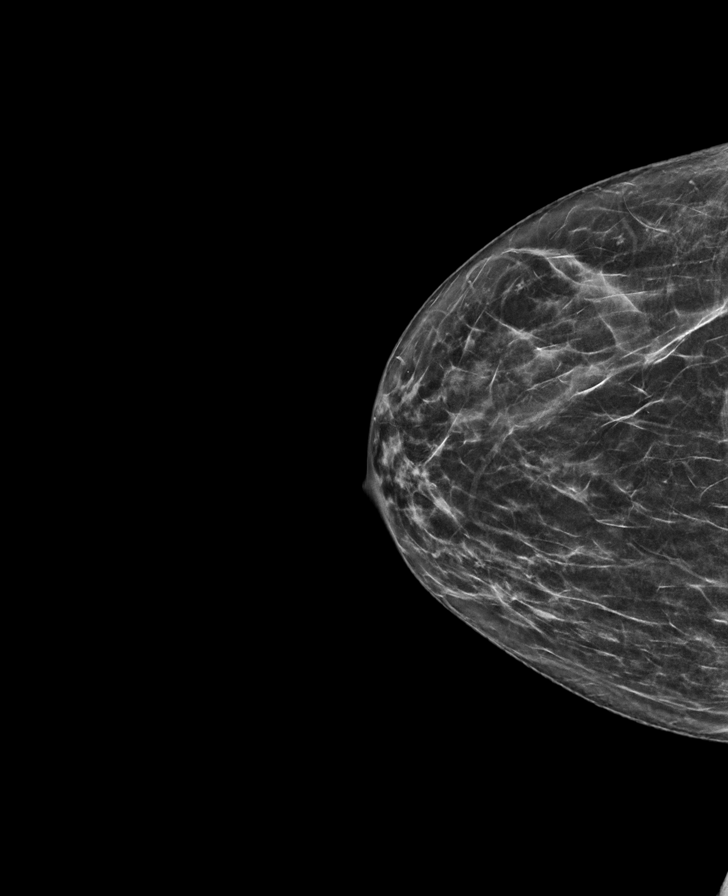

[R MLO synth-2D]
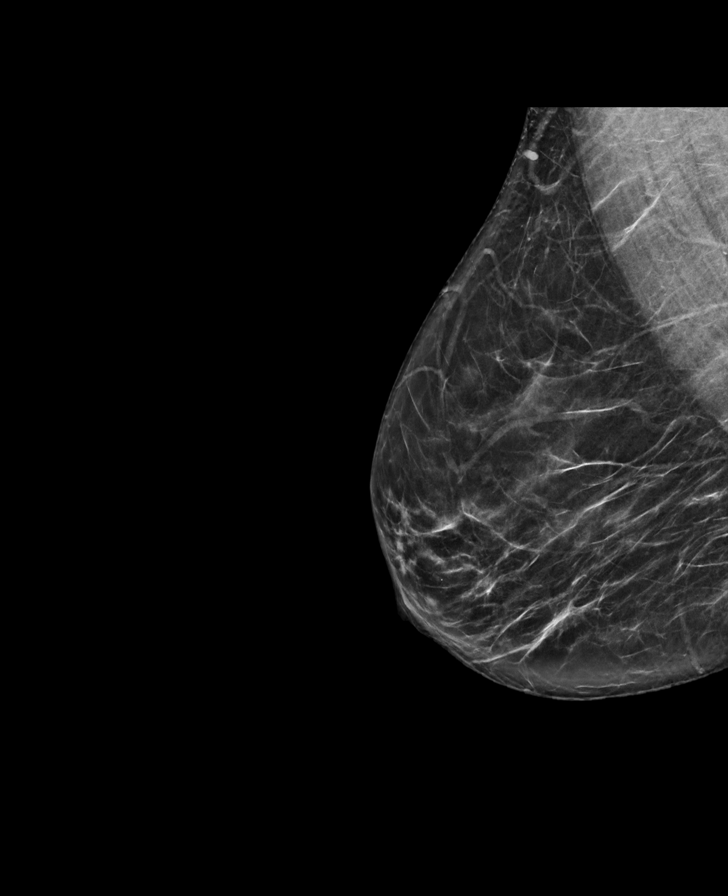

[R CC tomo · tomo slice 32/63.0]
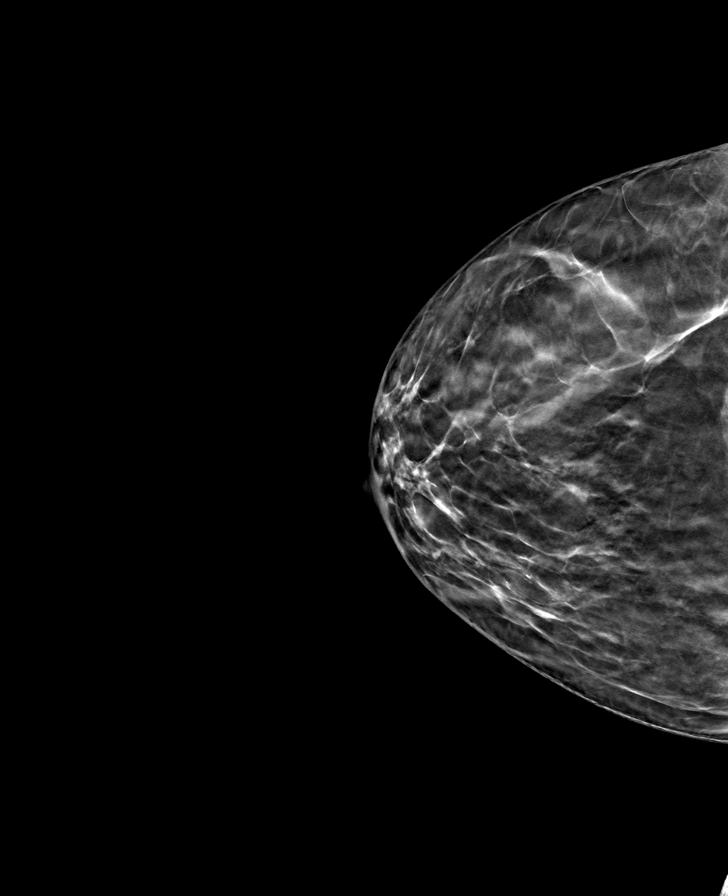

[L MLO tomo · tomo slice 35/68.0]
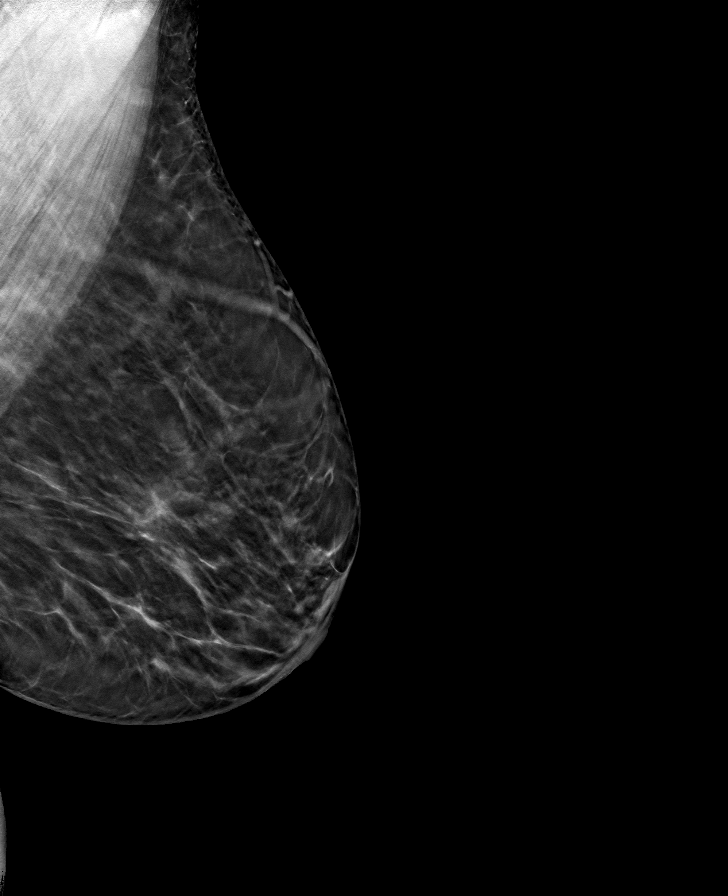

[R MLO tomo · tomo slice 36/71.0]
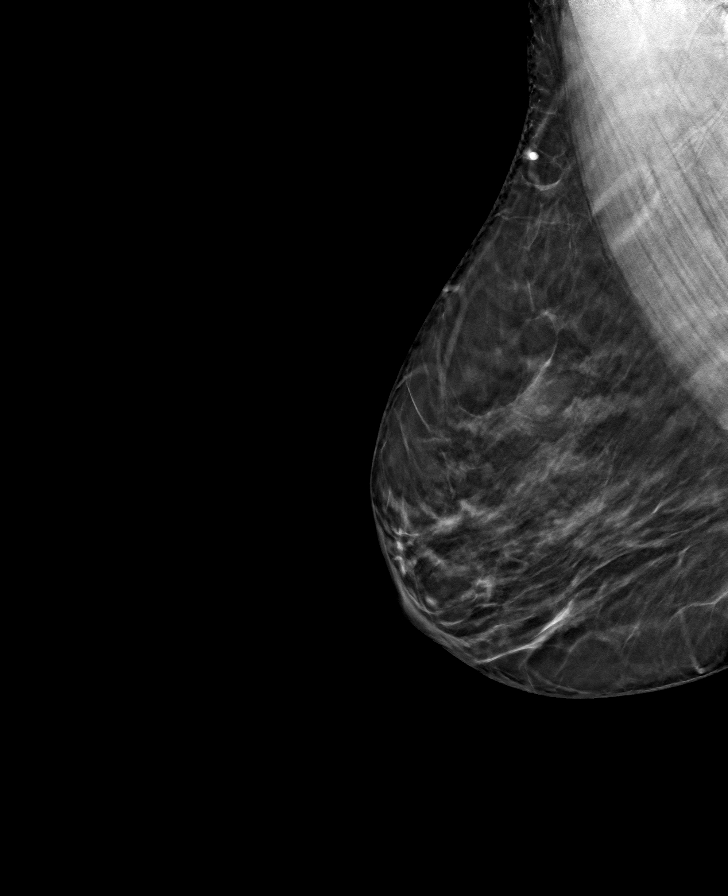

[L CC tomo · tomo slice 35/69.0]
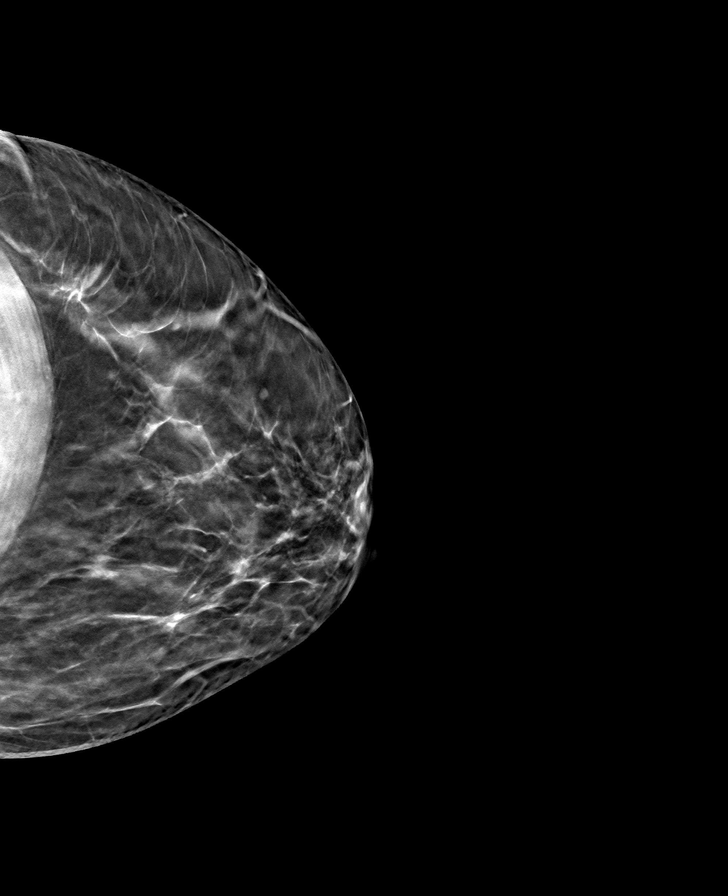

[8 of 24 positions shown; findings below may reference images not displayed]

ACR Breast Density Category b: There are scattered areas of
fibroglandular density.
FINDINGS: There are no findings suspicious for malignancy. Images were
processed with CAD.
IMPRESSION: No mammographic evidence of malignancy. A result letter of this
screening mammogram will be mailed directly to the patient.

RECOMMENDATION:
Screening mammogram in one year. (Code:[id number])

BI-RADS CATEGORY  1: Negative.

## 2021-02-05 ENCOUNTER — Other Ambulatory Visit: Payer: Self-pay | Admitting: Obstetrics and Gynecology

## 2021-02-05 DIAGNOSIS — Z1231 Encounter for screening mammogram for malignant neoplasm of breast: Secondary | ICD-10-CM

## 2023-06-08 LAB — COLOGUARD: COLOGUARD: NEGATIVE
